# Patient Record
Sex: Female | Born: 1980
Health system: Southern US, Community
[De-identification: ages and names within clinical notes are randomized; demographics above are authoritative.]

## PROBLEM LIST (undated history)

## (undated) DIAGNOSIS — E079 Disorder of thyroid, unspecified: Secondary | ICD-10-CM

## (undated) DIAGNOSIS — I10 Essential (primary) hypertension: Secondary | ICD-10-CM

## (undated) HISTORY — PX: THYROID SURGERY: SHX805

---
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
## 2001-10-29 ENCOUNTER — Other Ambulatory Visit: Admission: RE | Admit: 2001-10-29 | Discharge: 2001-10-29 | Payer: Self-pay | Admitting: Gynecology

## 2002-11-07 ENCOUNTER — Other Ambulatory Visit: Admission: RE | Admit: 2002-11-07 | Discharge: 2002-11-07 | Payer: Self-pay | Admitting: Gynecology

## 2003-10-08 ENCOUNTER — Other Ambulatory Visit: Admission: RE | Admit: 2003-10-08 | Discharge: 2003-10-08 | Payer: Self-pay | Admitting: Gynecology

## 2004-06-14 ENCOUNTER — Other Ambulatory Visit: Admission: RE | Admit: 2004-06-14 | Discharge: 2004-06-14 | Payer: Self-pay | Admitting: Gynecology

## 2004-07-19 ENCOUNTER — Other Ambulatory Visit: Admission: RE | Admit: 2004-07-19 | Discharge: 2004-07-19 | Payer: Self-pay | Admitting: Gynecology

## 2004-07-25 ENCOUNTER — Emergency Department (HOSPITAL_COMMUNITY): Admission: EM | Admit: 2004-07-25 | Discharge: 2004-07-25 | Payer: Self-pay | Admitting: Family Medicine

## 2004-08-08 ENCOUNTER — Other Ambulatory Visit: Admission: RE | Admit: 2004-08-08 | Discharge: 2004-08-08 | Payer: Self-pay | Admitting: Gynecology

## 2004-08-25 ENCOUNTER — Encounter: Admission: RE | Admit: 2004-08-25 | Discharge: 2004-08-25 | Payer: Self-pay | Admitting: Family Medicine

## 2004-09-11 ENCOUNTER — Emergency Department (HOSPITAL_COMMUNITY): Admission: EM | Admit: 2004-09-11 | Discharge: 2004-09-12 | Payer: Self-pay | Admitting: Emergency Medicine

## 2004-09-19 ENCOUNTER — Encounter (INDEPENDENT_AMBULATORY_CARE_PROVIDER_SITE_OTHER): Payer: Self-pay | Admitting: *Deleted

## 2004-09-19 ENCOUNTER — Ambulatory Visit (HOSPITAL_COMMUNITY): Admission: RE | Admit: 2004-09-19 | Discharge: 2004-09-19 | Payer: Self-pay | Admitting: Family Medicine

## 2004-10-12 ENCOUNTER — Encounter: Admission: RE | Admit: 2004-10-12 | Discharge: 2004-10-12 | Payer: Self-pay | Admitting: Otolaryngology

## 2004-10-14 ENCOUNTER — Encounter: Admission: RE | Admit: 2004-10-14 | Discharge: 2004-10-14 | Payer: Self-pay | Admitting: Otolaryngology

## 2004-12-01 ENCOUNTER — Ambulatory Visit (HOSPITAL_COMMUNITY): Admission: RE | Admit: 2004-12-01 | Discharge: 2004-12-02 | Payer: Self-pay | Admitting: General Surgery

## 2004-12-01 ENCOUNTER — Encounter (INDEPENDENT_AMBULATORY_CARE_PROVIDER_SITE_OTHER): Payer: Self-pay | Admitting: Specialist

## 2005-01-17 ENCOUNTER — Observation Stay (HOSPITAL_COMMUNITY): Admission: RE | Admit: 2005-01-17 | Discharge: 2005-01-18 | Payer: Self-pay | Admitting: General Surgery

## 2005-01-17 ENCOUNTER — Encounter (INDEPENDENT_AMBULATORY_CARE_PROVIDER_SITE_OTHER): Payer: Self-pay | Admitting: *Deleted

## 2005-03-08 ENCOUNTER — Other Ambulatory Visit: Admission: RE | Admit: 2005-03-08 | Discharge: 2005-03-08 | Payer: Self-pay | Admitting: Gynecology

## 2005-10-23 ENCOUNTER — Other Ambulatory Visit: Admission: RE | Admit: 2005-10-23 | Discharge: 2005-10-23 | Payer: Self-pay | Admitting: Gynecology

## 2006-04-27 ENCOUNTER — Other Ambulatory Visit: Admission: RE | Admit: 2006-04-27 | Discharge: 2006-04-27 | Payer: Self-pay | Admitting: Gynecology

## 2007-01-22 IMAGING — RF DG ESOPHAGUS
12 series · 20 of 20 positions shown · non-contrast
Comparison: none

CLINICAL DATA: Difficulty swallowing. 
 BARIUM SWALLOW:
 A double contrast barium swallow was performed.  The mucosa of the esophagus appears normal.  The swallowing mechanism is normal and esophageal peristalsis is normal.  No hiatal hernia or reflux is seen.  A barium pill was given at the end of the study which passed into the stomach without delay.

[Series 1: run · 1 of 1 slices shown (1 of 12)]
[im 1/1]
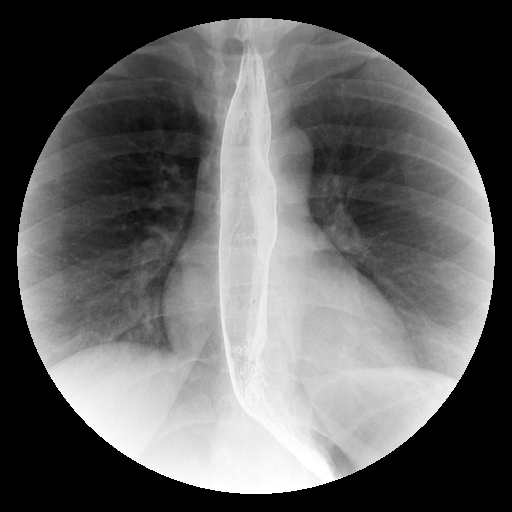

[Series 2: run · 1 of 1 slices shown (2 of 12)]
[im 1/1]
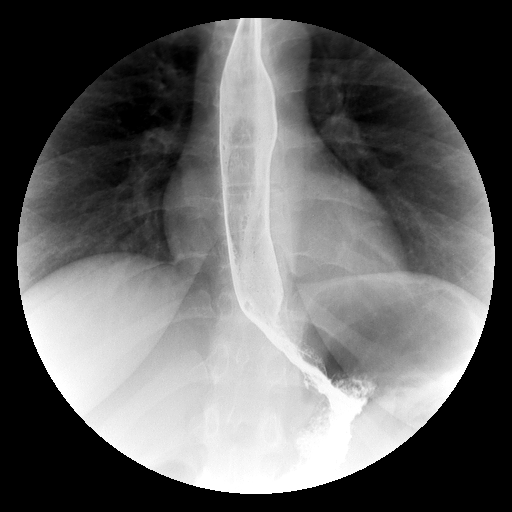

[Series 4: run · 1 of 1 slices shown (3 of 12)]
[im 1/1]
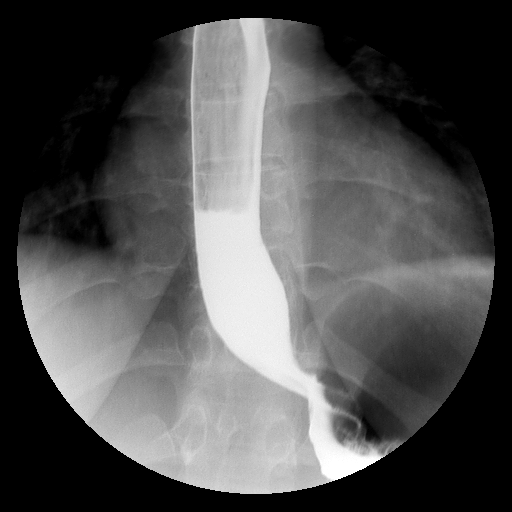

[Series 5: run · 6 of 6 slices shown (4 of 12)]
[im 1/6]
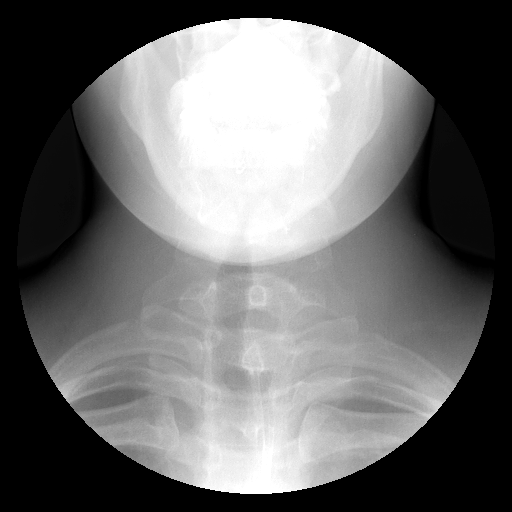
[im 2/6]
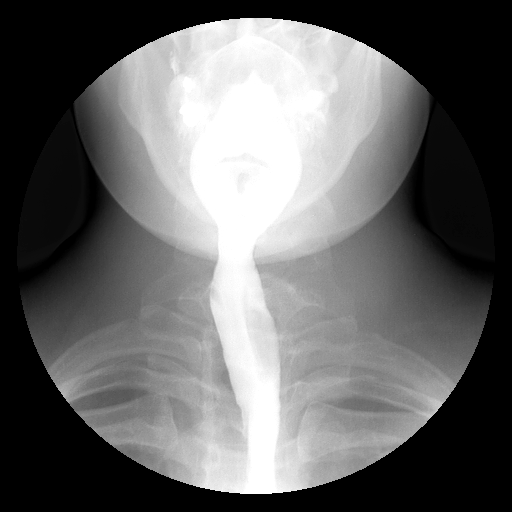
[im 3/6]
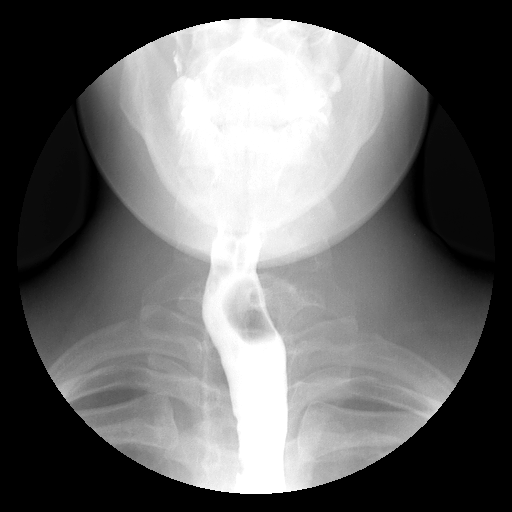
[im 4/6]
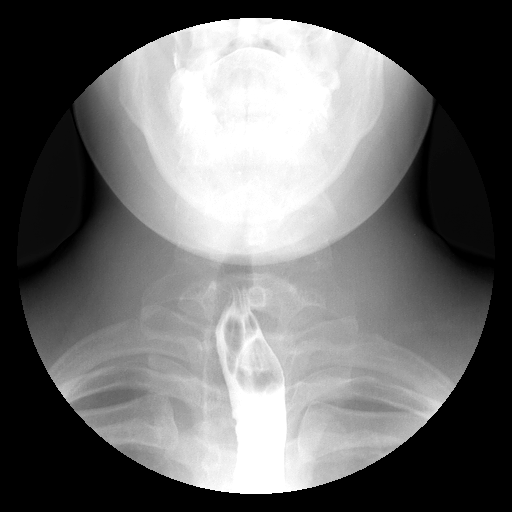
[im 5/6]
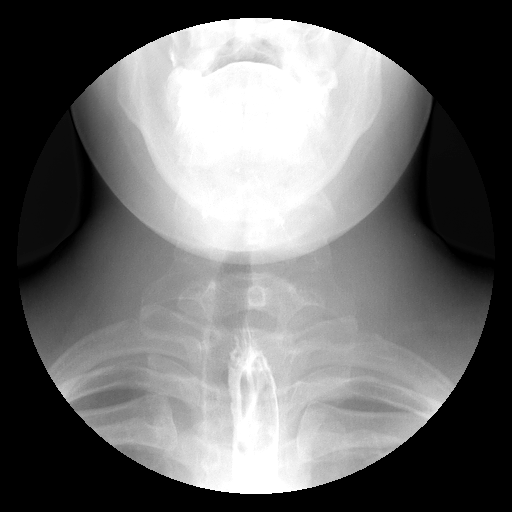
[im 6/6]
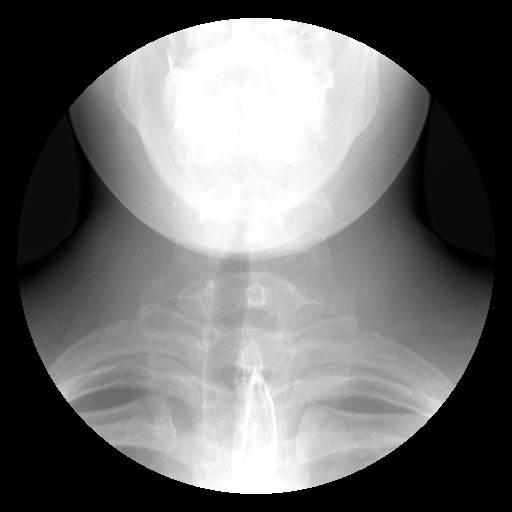

[Series 6: run · 4 of 4 slices shown (5 of 12)]
[im 1/4]
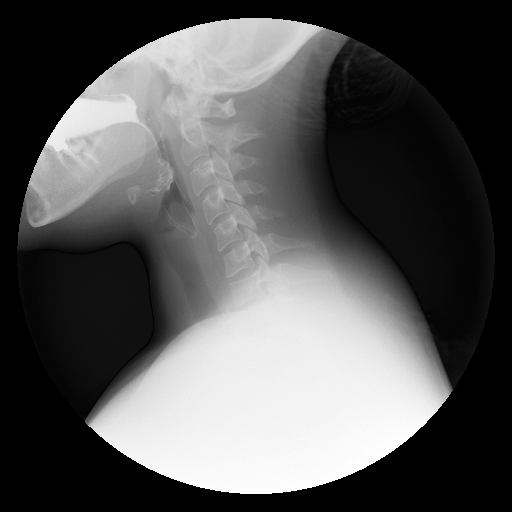
[im 2/4]
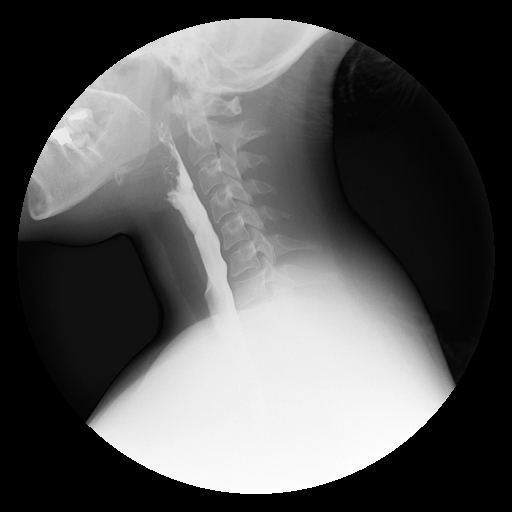
[im 3/4]
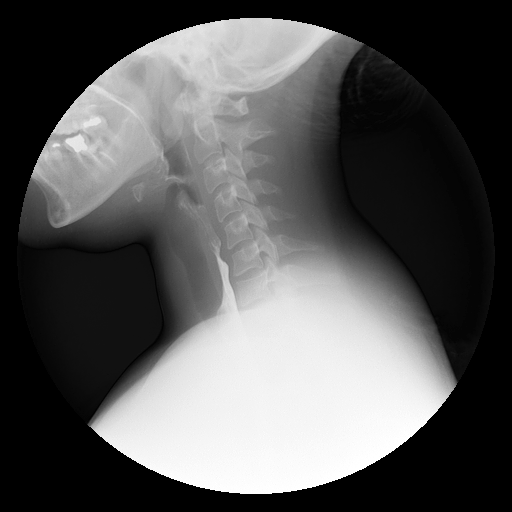
[im 4/4]
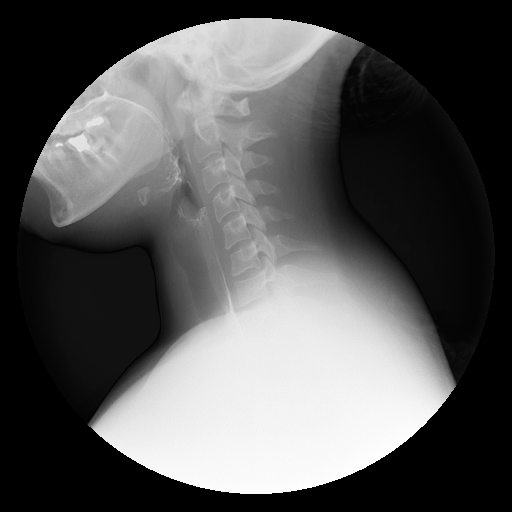

[Series 7: run · 1 of 1 slices shown (6 of 12)]
[im 1/1]
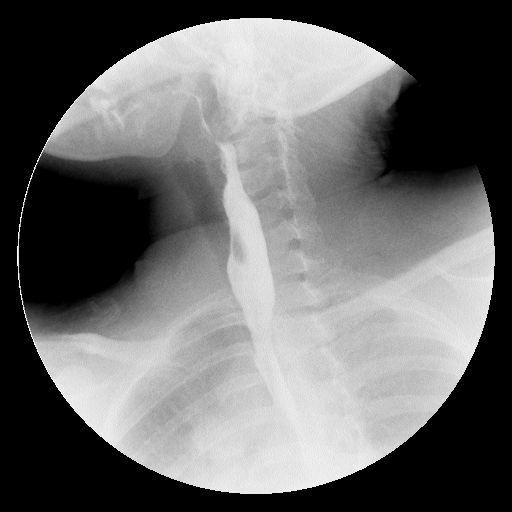

[Series 8: run · 1 of 1 slices shown (7 of 12)]
[im 1/1]
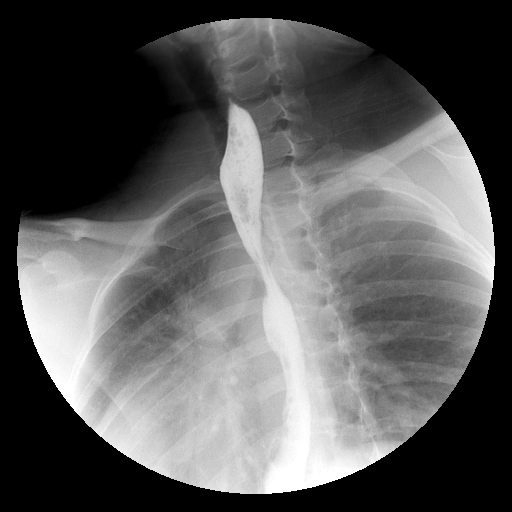

[Series 9: run · 1 of 1 slices shown (8 of 12)]
[im 1/1]
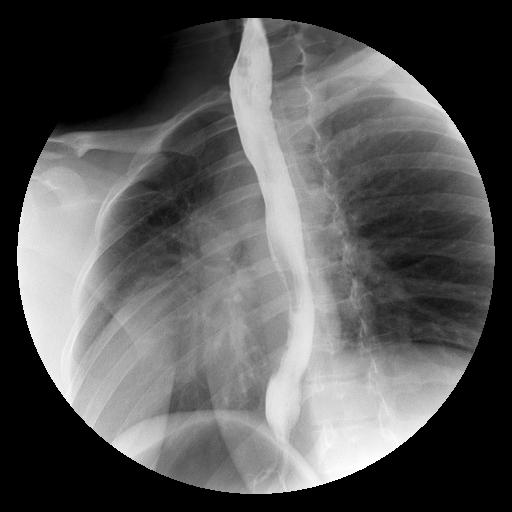

[Series 10: run · 1 of 1 slices shown (9 of 12)]
[im 1/1]
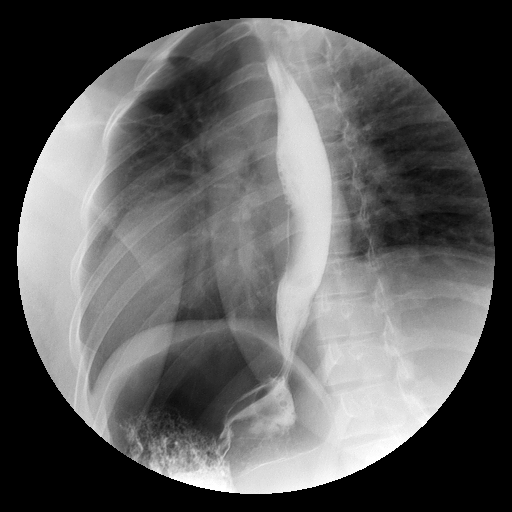

[Series 11: run · 1 of 1 slices shown (10 of 12)]
[im 1/1]
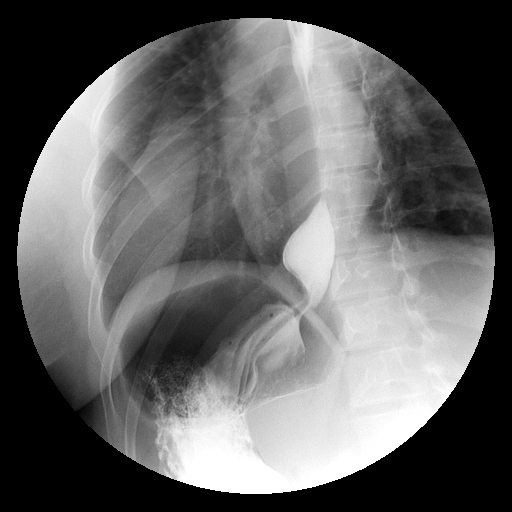

[Series 12: run · 1 of 1 slices shown (11 of 12)]
[im 1/1]
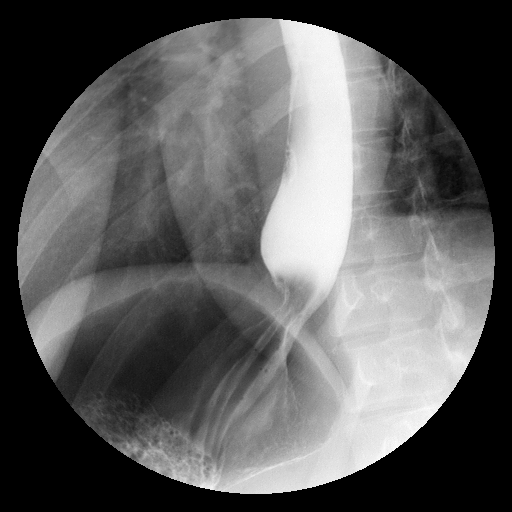

[Series 13: run · 1 of 1 slices shown (12 of 12)]
[im 1/1]
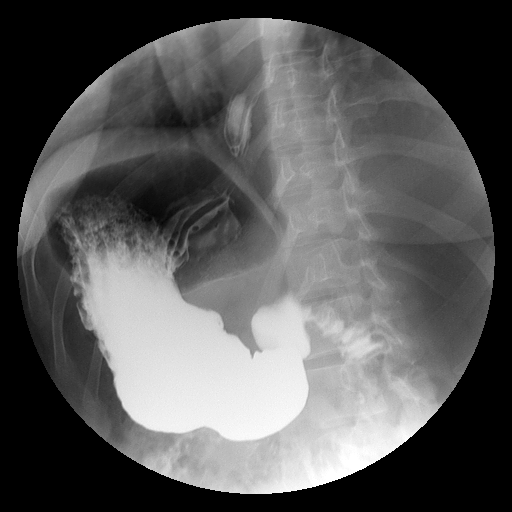

[20 of 20 positions shown; findings below may reference images not displayed]

IMPRESSION: Negative barium swallow.

## 2007-09-24 ENCOUNTER — Other Ambulatory Visit: Admission: RE | Admit: 2007-09-24 | Discharge: 2007-09-24 | Payer: Self-pay | Admitting: Family Medicine

## 2007-11-05 ENCOUNTER — Ambulatory Visit (HOSPITAL_BASED_OUTPATIENT_CLINIC_OR_DEPARTMENT_OTHER): Admission: RE | Admit: 2007-11-05 | Discharge: 2007-11-05 | Payer: Self-pay | Admitting: Orthopedic Surgery

## 2008-10-20 ENCOUNTER — Inpatient Hospital Stay (HOSPITAL_COMMUNITY): Admission: RE | Admit: 2008-10-20 | Discharge: 2008-10-22 | Payer: Self-pay | Admitting: Obstetrics and Gynecology

## 2009-10-22 ENCOUNTER — Other Ambulatory Visit: Admission: RE | Admit: 2009-10-22 | Discharge: 2009-10-22 | Payer: Self-pay | Admitting: Family Medicine

## 2010-03-05 ENCOUNTER — Ambulatory Visit: Payer: Self-pay | Admitting: Radiology

## 2010-03-05 ENCOUNTER — Emergency Department (HOSPITAL_BASED_OUTPATIENT_CLINIC_OR_DEPARTMENT_OTHER): Admission: EM | Admit: 2010-03-05 | Discharge: 2010-03-05 | Payer: Self-pay | Admitting: Emergency Medicine

## 2010-06-17 ENCOUNTER — Ambulatory Visit: Payer: Self-pay | Admitting: Diagnostic Radiology

## 2010-06-17 ENCOUNTER — Emergency Department (HOSPITAL_BASED_OUTPATIENT_CLINIC_OR_DEPARTMENT_OTHER): Admission: EM | Admit: 2010-06-17 | Discharge: 2010-06-18 | Payer: Self-pay | Admitting: Emergency Medicine

## 2010-11-09 LAB — BASIC METABOLIC PANEL
BUN: 11 mg/dL (ref 6–23)
CO2: 23 mEq/L (ref 19–32)
Calcium: 9.1 mg/dL (ref 8.4–10.5)
Chloride: 108 mEq/L (ref 96–112)
Creatinine, Ser: 0.8 mg/dL (ref 0.4–1.2)
GFR calc Af Amer: >60 mL/min (ref >60)
GFR calc non Af Amer: >60 mL/min (ref >60)
Glucose, Bld: 84 mg/dL (ref 70–99)
Potassium: 3.9 mEq/L (ref 3.5–5.1)
Sodium: 140 mEq/L (ref 135–145)

## 2010-11-09 LAB — DIFFERENTIAL
Basophils Absolute: 0.1 10*3/uL (ref 0.0–0.1)
Basophils Relative: 1 % (ref 0–1)
Eosinophils Absolute: 0.1 10*3/uL (ref 0.0–0.7)
Eosinophils Relative: 1 % (ref 0–5)
Lymphocytes Relative: 28 % (ref 12–46)
Lymphs Abs: 2.8 10*3/uL (ref 0.7–4.0)
Monocytes Absolute: 0.5 10*3/uL (ref 0.1–1.0)
Monocytes Relative: 5 % (ref 3–12)
Neutro Abs: 6.6 10*3/uL (ref 1.7–7.7)
Neutrophils Relative %: 66 % (ref 43–77)

## 2010-11-09 LAB — POCT CARDIAC MARKERS
CKMB, poc: <1 ng/mL — ABNORMAL LOW (ref 1.0–8.0)
Myoglobin, poc: 41.5 ng/mL (ref 12–200)
Troponin i, poc: <0.05 ng/mL (ref 0.00–0.09)

## 2010-11-09 LAB — CBC
HCT: 38 % (ref 36.0–46.0)
Hemoglobin: 12.6 g/dL (ref 12.0–15.0)
MCH: 32 pg (ref 26.0–34.0)
MCHC: 33.3 g/dL (ref 30.0–36.0)
MCV: 96 fL (ref 78.0–100.0)
Platelets: 237 10*3/uL (ref 150–400)
RBC: 3.95 MIL/uL (ref 3.87–5.11)
RDW: 12.8 % (ref 11.5–15.5)
WBC: 10.1 10*3/uL (ref 4.0–10.5)

## 2010-12-13 LAB — CBC
HCT: 38.9 % (ref 36.0–46.0)
Hemoglobin: 13.2 g/dL (ref 12.0–15.0)
MCHC: 33.8 g/dL (ref 30.0–36.0)
MCV: 101.5 fL — ABNORMAL HIGH (ref 78.0–100.0)
Platelets: 163 10*3/uL (ref 150–400)
RBC: 3.84 MIL/uL — ABNORMAL LOW (ref 3.87–5.11)
RDW: 13 % (ref 11.5–15.5)
WBC: 10 10*3/uL (ref 4.0–10.5)

## 2010-12-13 LAB — RPR: RPR Ser Ql: NONREACTIVE

## 2010-12-13 LAB — NO BLOOD PRODUCTS

## 2011-01-10 NOTE — Op Note (Signed)
[name], [name]               ACCOUNT NO.:  [id number]   MEDICAL RECORD NO.:  [id number]          PATIENT TYPE:  AMB   LOCATION:  DSC                          FACILITY:  [facility]   PHYSICIAN:  Leonides Grills, M.D.     DATE OF BIRTH:  21-Mar-1981   DATE OF PROCEDURE:  11/05/2007  DATE OF DISCHARGE:                               OPERATIVE REPORT   PREOPERATIVE DIAGNOSIS:  Left hallux valgus.   POSTOPERATIVE DIAGNOSIS:  Left hallux valgus.   OPERATION:  1. Left Chevron bunionectomy.  2. Stress x-rays, left foot.  3. Left great toe digital nerve neurolysis.   ANESTHESIA:  General.   SURGEON:  Leonides Grills, MD.   ASSISTANT:  Richardean Canal, PA-C.   ESTIMATED BLOOD LOSS:  Minimal.   TOURNIQUET TIME:  Approximately 40 minutes.   COMPLICATIONS:  None.   DISPOSITION:  Stable to PR.   INDICATIONS:  This is a 30 year old female who has had longstanding left  bunion pain despite wearing wider toe box shoes and occasional anti-  inflammatories.  She was consented for the above procedure.  All the  risks including infection, nerve vessel injury, nonunion, malunion,  hardware rotation,  hardware failure, stiffness, arthritis, recurrence  of hallux valgus deformity and development of hallux varus were all  explained.  Questions were encouraged and answered.   DESCRIPTION OF PROCEDURE:  The patient was brought to the operating room  and placed in supine position after adequate general endotracheal tube  anesthesia was administered as well as Ancef 1 gram IV piggyback.  The  left lower extremity was then prepped and draped in a sterile manner.  Over a proximally placed thigh tourniquet, the limb was gravity  exsanguinated and the tourniquet was slowly inflated to 90 mmHg.  A  longitudinal incision over the medial aspect of the left great toe MTP  joint was then made.  Dissection was carried down through skin.  Hemostasis was obtained.  A formal digital nerve neurolysis was  performed on  the dorsomedial digital nerve.  This was then retracted out  of harm's way.  An L-shaped capsulotomy was then made.  Simple  bunionectomy then performed with a sagittal saw.  Rocky Link Johnson's ridge  was then rounded off with a rongeur.  The joint area was copiously  irrigated with normal saline.  The lateral capsule was then released  with a curved Beaver blade.  This helped release the tight capsule.  The  center head was then identified.  The soft tissue was elevated both  superiorly and inferiorly.  A Chevron osteotomy was created with a  sagittal saw.  The head was then translated about 3-4 mm laterally.  This was then fixed with a 2.0-mm fully threaded cortical set screw  using a 1.5-mm drill hole respectively.  Excellent purchase was made of  the reduction.  This was countersunk.  The redundant bone medially was  then trimmed off with the sagittal saw.  The area joint was copiously  irrigated with normal saline.  The capsule was advanced both superiorly  and proximally and reconstructed with 2-0 Vicryl stitches, and  had an  Conservation officer, historic buildings.  The tourniquet was deflated.  Hemostasis was  obtained.  Stress x-rays were obtained in the AP and lateral planes and  showed no gross motion across the osteotomy site fixation, proper  position and excellent alignment as well.  The wound was closed with 4-0  nylon stitch.  A sterile dressing was applied.  A Roger Mann dressing  was applied.  Hard sole shoe was applied.  The patient was stable to the  PR.      Leonides Grills, M.D.  Electronically Signed     PB/MEDQ  D:  11/05/2007  T:  11/06/2007  Job:  [id number]

## 2011-01-10 NOTE — Discharge Summary (Signed)
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   DISCHARGE DIAGNOSES:  Intrauterine pregnancy at 41 weeks and group B  streptococci carrier.   PROCEDURE:  On February 23, she had a spontaneous vaginal delivery.   HISTORY AND PHYSICAL:  This is a 30 year old gravida 2, para 1-0-0-1  with an EGA of [redacted] weeks, who presents for induction.  Prenatal care  essentially uncomplicated.   PRENATAL LABS:  RPR is nonreactive, hepatitis B surface antigen  negative, rubella immune, HIV negative, blood type is A+ with a negative  antibody screen, gonorrhea and Chlamydia negative, normal adult  hemoglobin, cystic fibrosis is negative, first trimester screen is  normal, MSAFP is normal, 1-hour Glucola is 119, and group B strep is  positive.   PAST OBSTETRICAL HISTORY:  In 2000, vaginal delivery at 38 weeks, 6  pounds and 6 ounces.  No complications.   PAST MEDICAL HISTORY:  Hypothyroidism.   PAST SURGICAL HISTORY:  Thyroidectomy and bunion surgery.   ALLERGIES:  None.   MEDICATIONS:  Synthroid 175 mcg.   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   PHYSICAL EXAMINATION:  She is afebrile with stable vital signs.  Fetal  heart tracing is level 1 with irregular contractions.  Abdomen, gravid  and nontender with an estimated fetal weight of 7 pounds.  Cervix is 2-  3, 60, -2 vertex presentation, adequate pelvis.  I attempted to rupture  membranes, but got no fluid.   HOSPITAL COURSE:  The patient was admitted and started on penicillin for  group B strep  prophylaxis.  She had Pitocin started for induction and I  also attempted rupture of membranes.  She progressed into labor,  received an epidural, progressed fairly quickly to complete, pushed well  and on February 23, had a vaginal delivery of a viable female infant  with Apgars of 9 and 9 and weighed 7 pounds 0 ounces.  Placenta  delivered spontaneously and was intact.  Perineum was intact, and  estimated blood loss was less than 500 mL.  Postpartum, she had no  significant problems and on postpartum #2, she was stable enough for  discharge home.  Predelivery hemoglobin was 13.2 and it was not checked  after delivery.   DISCHARGE INSTRUCTIONS:  Regular diet, pelvic rest, followup in 6 weeks.   MEDICATIONS:  Percocet #20 one to two p.o. q.4-6 h. p.r.n. pain and over-  the-counter ibuprofen as needed, and she was given our discharge  pamphlet.       Zenaida Niece, M.D.  Electronically Signed     TDM/MEDQ  D:  10/22/2008  T:  10/22/2008  Job:  [id number]

## 2011-01-13 NOTE — Op Note (Signed)
NAMEMAHREEN, [name]               ACCOUNT NO.:  [id number]   MEDICAL RECORD NO.:  [id number]          PATIENT TYPE:  OIB   LOCATION:  2899                         FACILITY:  [facility]   PHYSICIAN:  Gita Kudo, M.D. DATE OF BIRTH:  Jul 01, 1981   DATE OF PROCEDURE:  12/01/2004  DATE OF DISCHARGE:                                 OPERATIVE REPORT   OPERATIVE PROCEDURE:  Left thyroid lobectomy.   SURGEON:  Gita Kudo, M.D.   ANESTHESIA:  General endotracheal.   PREOPERATIVE DIAGNOSIS:  Mass left thyroid lobe.   POSTOP DIAGNOSIS:  Mass left thyroid lobe, pending final pathology.   CLINICAL SUMMARY:  A 30 year old female with enlarging mass in her left  neck. Has had previous needle aspiration cytology. The lump has increased in  size and she comes in now for elective excision. She has had a second  opinion from Dr. Hermelinda Medicus also.   OPERATIVE FINDINGS:  The left lobe was quite enlarged and felt significantly  cystic. The lobe was soft. The right side felt and looked normal. The left  recurrent nerve was identified and not injured. At least one parathyroid was  noted and not injured.   OPERATIVE PROCEDURE:  Under satisfactory general endotracheal anesthesia,  the patient was positioned, prepped and draped in standard fashion. A  transverse incision was made in a neck crease and carried down through the  platysma. Then flaps were developed superiorly and inferiorly and self-  retaining retractors placed. The midline was opened and strap muscles  retracted laterally. The large left lobe of the thyroid was easily  manipulated into good exposure. Superior pole vessels taken down between  clamps and ties of 2-0 silk and large metal clips. Medially the soft tissue  was divided between clips and using cautery. The recurrent nerve was  identified and not injured. The inferior vein was divided between clips.  Then the gland was rolled medially and taken off the trachea. The  pyramidal  lobe was divided and dissected away from the thyroid with cautery and then  the isthmus was divided close to the right lobe. The specimen was then sent  for pathology. The isthmus was secured with 3-0 Vicryl suture. The wound was  lavaged with saline, checked for hemostasis and then closed while waiting  for pathology. A small piece of Surgicel was placed in the operative bed.  The wound was closed in layers with running #3-0 Vicryl for the midline,  interrupted 3-0 Vicryl for the platysma, 4-0 Vicryl for subcu and Steri-  Strips for skin. Sterile dressings were applied and the patient went to the  recovery room from the operating room in good condition.   ADDENDUM:  The pathologist called back for report of the frozen section. He  could not definitely say that it was malignant nor benign. Several more  studies needed to be done and they recommended no further surgery at this  point but to wait for the permanents.      MRL/MEDQ  D:  12/01/2004  T:  12/01/2004  Job:  [id number]   cc:   Chesley Mires  Virl Son, M.D.  [address]  [place]  [place] [address]  Fax: [phone]   Hermelinda Medicus, M.D.  [address]  [place] [address]  Fax: [phone]

## 2011-01-13 NOTE — Op Note (Signed)
NAMECASSANDRA, [name]               ACCOUNT NO.:  [id number]   MEDICAL RECORD NO.:  [id number]          PATIENT TYPE:  AMB   LOCATION:  DAY                          FACILITY:  [facility]   PHYSICIAN:  Gita Kudo, M.D. DATE OF BIRTH:  November 23, 1980   DATE OF PROCEDURE:  01/17/2005  DATE OF DISCHARGE:                                 OPERATIVE REPORT   OPERATIVE PROCEDURE:  Right thyroid lobectomy with right superior  parathyroid cystectomy, completion thyroidectomy.   SURGEON:  Gita Kudo, M.D.   ASSISTANT SURGEON:  Velora Heckler, M.D.   ANESTHESIA:  General endotracheal.   PREOPERATIVE DIAGNOSIS:  Left thyroid carcinoma.   POSTOPERATIVE DIAGNOSIS:  Left thyroid carcinoma, completion thyroidectomy.   CLINICAL SUMMARY:  This 30 year old lady underwent a left thyroid lobectomy  on April 6. Initial pathology was equivocal. 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 It was quite small. After deliberation, we decided to  do a completion thyroidectomy.   OPERATIVE FINDINGS:  The right lobe of the thyroid appeared normal in size  and no palpable abnormality. The right superior parathyroid was enlarged and  had a small cyst. It was felt reasonable to remove this with the specimen.  The recurrent nerve was identified and not injured.   OPERATIVE PROCEDURE:  Under satisfactory general endotracheal anesthesia,  the patient had received 1.0 grams Ancef and was prepped, positioned and  draped in standard fashion. The previous incision was reopened in its  lateral 2/3 and extended somewhat laterally. Carried down to the platysma  and flaps developed. The hemostasis was good by cautery or silk tie. The  midline was opened. There was a little bit of difficulty because of her  previous recent surgery. The strap muscles retracted laterally. The superior  pole was taken down between metal clips and 2-0 silk ties. The  inferior and  medial veins were divided between ties. Then the thyroid gland was rolled  medially and carefully dissected away from the recurrent nerve which was  identified. I saw the inferior parathyroid and left it alone. The superior  parathyroid looked a little large and had a large cyst on it and therefore I  removed it. The dissection was carried down to the trachea and the gland  rolled medially until it was totally removed. Cautery was used to control  bleeding, as was tie and metal clips. A small piece of Surgicel was placed  in the wound and pressure held after lavage with saline and there appeared  to be no bleeding. The specimen was marked for pathology and  sent. The wound was then closed in layers with running 4-0 for the midline,  interrupted 4-0 platysma, 4-0 subcu - all Vicryl. Skin edges approximated  with Steri-Strips and sterile dressing applied. There were no complications.  The sponge and needle counts were correct.      MRL/MEDQ  D:  01/17/2005  T:  01/17/2005  Job:  [id number]   cc:   Otilio Connors. Gerri Spore, M.D.  [address]  [place]  [place] [address]  Fax: [phone]

## 2011-02-15 ENCOUNTER — Other Ambulatory Visit: Payer: Self-pay | Admitting: Family Medicine

## 2011-02-15 ENCOUNTER — Other Ambulatory Visit (HOSPITAL_COMMUNITY)
Admission: RE | Admit: 2011-02-15 | Discharge: 2011-02-15 | Disposition: A | Discharge status: Home / Self Care | Payer: BC Managed Care – PPO | Source: Ambulatory Visit | Attending: Family Medicine | Admitting: Family Medicine

## 2011-02-15 DIAGNOSIS — Z124 Encounter for screening for malignant neoplasm of cervix: Secondary | ICD-10-CM | POA: Insufficient documentation

## 2011-05-22 LAB — POCT HEMOGLOBIN-HEMACUE: Hemoglobin: 13

## 2011-06-08 ENCOUNTER — Emergency Department (HOSPITAL_BASED_OUTPATIENT_CLINIC_OR_DEPARTMENT_OTHER)
Admission: EM | Admit: 2011-06-08 | Discharge: 2011-06-09 | Disposition: A | Discharge status: Home / Self Care | Payer: BC Managed Care – PPO | Attending: Emergency Medicine | Admitting: Emergency Medicine

## 2011-06-08 ENCOUNTER — Encounter: Payer: Self-pay | Admitting: *Deleted

## 2011-06-08 ENCOUNTER — Emergency Department: Payer: Self-pay | Admitting: Emergency Medicine

## 2011-06-08 DIAGNOSIS — E079 Disorder of thyroid, unspecified: Secondary | ICD-10-CM | POA: Insufficient documentation

## 2011-06-08 DIAGNOSIS — X58XXXA Exposure to other specified factors, initial encounter: Secondary | ICD-10-CM | POA: Insufficient documentation

## 2011-06-08 DIAGNOSIS — S61309A Unspecified open wound of unspecified finger with damage to nail, initial encounter: Secondary | ICD-10-CM

## 2011-06-08 DIAGNOSIS — I10 Essential (primary) hypertension: Secondary | ICD-10-CM | POA: Insufficient documentation

## 2011-06-08 DIAGNOSIS — S61209A Unspecified open wound of unspecified finger without damage to nail, initial encounter: Secondary | ICD-10-CM | POA: Insufficient documentation

## 2011-06-08 HISTORY — DX: Essential (primary) hypertension: I10

## 2011-06-08 HISTORY — DX: Disorder of thyroid, unspecified: E07.9

## 2011-06-08 NOTE — ED Notes (Signed)
Finger placed in betadine soak, pt tolerated well

## 2011-06-08 NOTE — ED Notes (Signed)
A box fell off a shelf causing her fingernail to bend backward. Partial amputation of her right 5th fingernail noted.

## 2011-06-09 MED ORDER — IBUPROFEN 800 MG PO TABS
800.0000 mg | ORAL_TABLET | Freq: Once | ORAL | Status: AC
  Administered 2011-06-09: 800 mg via ORAL
  Filled 2011-06-09: qty 1

## 2011-06-09 MED ORDER — IBUPROFEN 800 MG PO TABS: 800.0000 mg | ORAL_TABLET | Freq: Three times a day (TID) | ORAL | Status: AC

## 2011-06-09 MED ORDER — BUPIVACAINE HCL 0.5 % IJ SOLN
INTRAMUSCULAR | Status: AC
  Administered 2011-06-09
  Filled 2011-06-09: qty 1

## 2011-06-09 MED ORDER — HYDROCODONE-ACETAMINOPHEN 5-500 MG PO TABS: 1.0000 | ORAL_TABLET | Freq: Four times a day (QID) | ORAL | Status: AC | PRN

## 2011-06-09 NOTE — ED Notes (Signed)
Dr Alto Denver at bs for nail removal.

## 2011-06-09 NOTE — ED Notes (Signed)
Bacitracin applied to nail bed and area dressed with non-adherent dressing, finger splint, and bulky kerlix dressing. Pt tolerated well.

## 2011-06-09 NOTE — ED Provider Notes (Signed)
History     CSN: [id number] Arrival date & time: 06/08/2011 11:32 PM  Chief Complaint  Patient presents with  . Finger Injury    (Consider location/radiation/quality/duration/timing/severity/associated sxs/prior treatment) HPI The patient is a 30 year old female who presents today after having avulsion injury to the fingernail of her right pinky finger. Patient had on gel nails one of these caught on the surface. The nail was peeled back with almost complete avulsion of the nail.  Patient says the pain is a 10 out of 10. It is made worse with palpation. It better by isolating and not palpating it. There are no other associated modifying factors.  Past Medical History  Diagnosis Date  . Thyroid disease   . Hypertension     Past Surgical History  Procedure Date  . Thyroid surgery     No family history on file.  History  Substance Use Topics  . Smoking status: Never Smoker   . Smokeless tobacco: Not on file  . Alcohol Use: No    OB History    Grav Para Term Preterm Abortions TAB SAB Ect Mult Living                  Review of Systems  All other systems reviewed and are negative.    Allergies  Review of patient's allergies indicates no known allergies.  Home Medications   Current Outpatient Rx  Name Route Sig Dispense Refill  . HYDROCHLOROTHIAZIDE PO Oral Take 1 tablet by mouth daily.      Marland Kitchen SYNTHROID PO Oral Take 1 tablet by mouth daily.      . SERTRALINE HCL PO Oral Take 1 tablet by mouth daily.      Marland Kitchen HYDROCODONE-ACETAMINOPHEN 5-500 MG PO TABS Oral Take 1-2 tablets by mouth every 6 (six) hours as needed for pain. 30 tablet 0  . IBUPROFEN 800 MG PO TABS Oral Take 1 tablet (800 mg total) by mouth 3 (three) times daily. 21 tablet 0    BP 136/97  Pulse 95  Temp(Src) 98.2 F (36.8 C) (Oral)  Resp 20  SpO2 100%  Physical Exam  Nursing note and vitals reviewed. Constitutional: She appears well-developed and well-nourished. No distress.  HENT:  Head:  Normocephalic and atraumatic.  Eyes: Conjunctivae are normal. Pupils are equal, round, and reactive to light.  Neck: Normal range of motion.  Musculoskeletal:       Patient has right fifth finger injury with nail with adhered ge nail  over top of it peeled back halfway proximally towards the hand. There is minimal active bleeding with this.  Skin: Skin is warm and dry. No rash noted.  Psychiatric: She has a normal mood and affect.    ED Course  Procedures (including critical care time)  Labs Reviewed - No data to display No results found.   1. Fingernail avulsion, complete       MDM  Patient was evaluated and did have a nailbed injury. She had a digital block performed on the affected finger on the right fifth digit. Patient received Marcaine for this. With this patient achieved good analgesia following this patient had the area prepped and draped in sterile fashion. Using a #11 blade as well as curved scissors the nail was peeled was able to be part away from the nailbed patient was discharged in good condition. She was discharged with prescriptions for pain medication as well as bacitracin ointment. Patient was bandage with this ointment and Vaseline gauze in place prior  to discharge. She was instructed on the use of all of her medications. Patient was discharged home in good condition.        Cyndra Numbers, MD 06/09/11 [phone]

## 2011-06-09 NOTE — ED Notes (Signed)
MD at bedside. 

## 2013-03-19 DIAGNOSIS — I10 Essential (primary) hypertension: Secondary | ICD-10-CM | POA: Insufficient documentation

## 2013-03-19 DIAGNOSIS — Z85850 Personal history of malignant neoplasm of thyroid: Secondary | ICD-10-CM | POA: Insufficient documentation

## 2016-01-04 ENCOUNTER — Encounter (HOSPITAL_BASED_OUTPATIENT_CLINIC_OR_DEPARTMENT_OTHER): Payer: Self-pay | Admitting: *Deleted

## 2016-01-04 ENCOUNTER — Emergency Department (HOSPITAL_BASED_OUTPATIENT_CLINIC_OR_DEPARTMENT_OTHER)
Admission: EM | Admit: 2016-01-04 | Discharge: 2016-01-04 | Disposition: A | Discharge status: Home / Self Care | Payer: 59 | Attending: Emergency Medicine | Admitting: Emergency Medicine

## 2016-01-04 ENCOUNTER — Emergency Department (HOSPITAL_BASED_OUTPATIENT_CLINIC_OR_DEPARTMENT_OTHER): Payer: 59

## 2016-01-04 DIAGNOSIS — I10 Essential (primary) hypertension: Secondary | ICD-10-CM | POA: Diagnosis not present

## 2016-01-04 DIAGNOSIS — R05 Cough: Secondary | ICD-10-CM | POA: Diagnosis present

## 2016-01-04 DIAGNOSIS — J40 Bronchitis, not specified as acute or chronic: Secondary | ICD-10-CM

## 2016-01-04 DIAGNOSIS — Z79899 Other long term (current) drug therapy: Secondary | ICD-10-CM | POA: Diagnosis not present

## 2016-01-04 DIAGNOSIS — J069 Acute upper respiratory infection, unspecified: Secondary | ICD-10-CM

## 2016-01-04 MED ORDER — ALBUTEROL SULFATE HFA 108 (90 BASE) MCG/ACT IN AERS
2.0000 | INHALATION_SPRAY | Freq: Four times a day (QID) | RESPIRATORY_TRACT | Status: DC
  Administered 2016-01-04: 2 via RESPIRATORY_TRACT
  Filled 2016-01-04: qty 6.7

## 2016-01-04 MED ORDER — IPRATROPIUM-ALBUTEROL 0.5-2.5 (3) MG/3ML IN SOLN
3.0000 mL | Freq: Four times a day (QID) | RESPIRATORY_TRACT | Status: DC
  Administered 2016-01-04: 3 mL via RESPIRATORY_TRACT
  Filled 2016-01-04: qty 3

## 2016-01-04 MED ORDER — DM-GUAIFENESIN ER 30-600 MG PO TB12: 1.0000 | ORAL_TABLET | Freq: Two times a day (BID) | ORAL | Status: DC

## 2016-01-04 MED FILL — MUCINEX DM ER 600-30 MG TAB: 30-600 | 10 days supply | Qty: 20 | Fill #0

## 2016-01-04 NOTE — ED Notes (Signed)
C/o cough/wheezing since Friday with nasal congestion. No fever. Also c/o dizziness. Has taken sudefed.

## 2016-01-04 NOTE — Discharge Instructions (Signed)
How to Use an Inhaler Using your inhaler correctly is very important. Good technique will make sure that the medicine reaches your lungs.  HOW TO USE AN INHALER: 1. Take the cap off the inhaler. 2. If this is the first time using your inhaler, you need to prime it. Shake the inhaler for 5 seconds. Release four puffs into the air, away from your face. Ask your doctor for help if you have questions. 3. Shake the inhaler for 5 seconds. 4. Turn the inhaler so the bottle is above the mouthpiece. 5. Put your pointer finger on top of the bottle. Your thumb holds the bottom of the inhaler. 6. Open your mouth. 7. Either hold the inhaler away from your mouth (the width of 2 fingers) or place your lips tightly around the mouthpiece. Ask your doctor which way to use your inhaler. 8. Breathe out as much air as possible. 9. Breathe in and push down on the bottle 1 time to release the medicine. You will feel the medicine go in your mouth and throat. 10. Continue to take a deep breath in very slowly. Try to fill your lungs. 11. After you have breathed in completely, hold your breath for 10 seconds. This will help the medicine to settle in your lungs. If you cannot hold your breath for 10 seconds, hold it for as long as you can before you breathe out. 12. Breathe out slowly, through pursed lips. Whistling is an example of pursed lips. 13. If your doctor has told you to take more than 1 puff, wait at least 15-30 seconds between puffs. This will help you get the best results from your medicine. Do not use the inhaler more than your doctor tells you to. 14. Put the cap back on the inhaler. 15. Follow the directions from your doctor or from the inhaler package about cleaning the inhaler. If you use more than one inhaler, ask your doctor which inhalers to use and what order to use them in. Ask your doctor to help you figure out when you will need to refill your inhaler.  If you use a steroid inhaler, always rinse your  mouth with water after your last puff, gargle and spit out the water. Do not swallow the water. GET HELP IF:  The inhaler medicine only partially helps to stop wheezing or shortness of breath.  You are having trouble using your inhaler.  You have some increase in thick spit (phlegm). GET HELP RIGHT AWAY IF:  The inhaler medicine does not help your wheezing or shortness of breath or you have tightness in your chest.  You have dizziness, headaches, or fast heart rate.  You have chills, fever, or night sweats.  You have a large increase of thick spit, or your thick spit is bloody. MAKE SURE YOU:   Understand these instructions.  Will watch your condition.  Will get help right away if you are not doing well or get worse.   This information is not intended to replace advice given to you by your health care provider. Make sure you discuss any questions you have with your health care provider.    Stop taking the Sudafed as we discussed because it elevates her blood pressure. Start taking the Mucinex DM use albuterol inhaler 2 puffs every 6 hours for the next 7 days. Make an appointment to follow-up with your regular Dr. Return for any new or worse symptoms. Work note provided.      Document Released: 05/23/2008 Document Revised:  06/04/2013 Document Reviewed: 03/13/2013 Elsevier Interactive Patient Education [employer].

## 2016-01-04 NOTE — ED Provider Notes (Signed)
CSN: [id number]     Arrival date & time 01/04/16  0855 History   First MD Initiated Contact with Patient 01/04/16 [phone]     Chief Complaint  Patient presents with  . Cough     (Consider location/radiation/quality/duration/timing/severity/associated sxs/prior Treatment) Patient is a 35 y.o. female presenting with cough. The history is provided by the patient.  Cough Associated symptoms: shortness of breath and wheezing   Associated symptoms: no chest pain, no fever, no headaches, no myalgias, no rash and no sore throat   Patient with complaint of cough congestion, occasionally productive cough, also feeling like she's been wheezing. Patient also concerned blood pressure's high. Patient has been taking Sudafed for the congestion. Patient also states she's been some mild dizziness but no room spinning. No nausea vomiting or diarrhea. No rash. No abdominal pain. No dysuria. Symptoms started on Friday.  Past Medical History  Diagnosis Date  . Thyroid disease   . Hypertension    Past Surgical History  Procedure Laterality Date  . Thyroid surgery     No family history on file. Social History  Substance Use Topics  . Smoking status: Never Smoker   . Smokeless tobacco: None  . Alcohol Use: No   OB History    No data available     Review of Systems  Constitutional: Negative for fever.  HENT: Positive for congestion. Negative for sore throat.   Eyes: Negative for redness.  Respiratory: Positive for cough, shortness of breath and wheezing.   Cardiovascular: Negative for chest pain.  Gastrointestinal: Negative for nausea, vomiting, abdominal pain and diarrhea.  Genitourinary: Negative for dysuria.  Musculoskeletal: Negative for myalgias.  Skin: Negative for rash.  Neurological: Positive for dizziness. Negative for headaches.  Hematological: Does not bruise/bleed easily.  Psychiatric/Behavioral: Negative for confusion.      Allergies  Review of patient's allergies indicates no  known allergies.  Home Medications   Prior to Admission medications   Medication Sig Start Date End Date Taking? Authorizing Provider  HYDROCHLOROTHIAZIDE PO Take 1 tablet by mouth daily.     Yes Historical Provider, MD  Levothyroxine Sodium (SYNTHROID PO) Take 1 tablet by mouth daily.     Yes Historical Provider, MD  SERTRALINE HCL PO Take 1 tablet by mouth daily.     Yes Historical Provider, MD  dextromethorphan-guaiFENesin (MUCINEX DM) 30-600 MG 12hr tablet Take 1 tablet by mouth 2 (two) times daily. 01/04/16   Vanetta MuldersScott Natesha Hassey, MD   BP 150/101 mmHg  Pulse 77  Temp(Src) 97.9 F (36.6 C) (Oral)  Resp 20  Ht 5\' 3"  (1.6 m)  Wt 108.863 kg  BMI 42.52 kg/m2  SpO2 100%  LMP 12/27/2015 (Approximate) Physical Exam  Constitutional: She is oriented to person, place, and time. She appears well-developed and well-nourished. No distress.  HENT:  Head: Normocephalic and atraumatic.  Mouth/Throat: Oropharynx is clear and moist. No oropharyngeal exudate.  Eyes: Conjunctivae and EOM are normal. Pupils are equal, round, and reactive to light.  Neck: Normal range of motion. Neck supple.  Cardiovascular: Normal rate, regular rhythm and normal heart sounds.   No murmur heard. Pulmonary/Chest: Effort normal. No respiratory distress. She has wheezes.  Abdominal: Soft. Bowel sounds are normal. There is no tenderness.  Musculoskeletal: Normal range of motion.  Neurological: She is alert and oriented to person, place, and time. No cranial nerve deficit. She exhibits normal muscle tone. Coordination normal.  Skin: Skin is warm. No rash noted.  Nursing note and vitals reviewed.   ED Course  Procedures (including critical care time) Labs Review Labs Reviewed - No data to display  Imaging Review Dg Chest 2 View  01/04/2016  CLINICAL DATA:  Nonproductive cough and wheezing for 4 days EXAM: CHEST  2 VIEW COMPARISON:  11/29/2004 FINDINGS: Normal heart size. Lungs clear. No pneumothorax. No pleural  effusion. IMPRESSION: No active cardiopulmonary disease. Electronically Signed   By: Jolaine Click M.D.   On: 01/04/2016 09:44   I have personally reviewed and evaluated these images and lab results as part of my medical decision-making.   EKG Interpretation None      MDM   Final diagnoses:  URI (upper respiratory infection)  Bronchitis    Patient with onset of upper respiratory infection symptoms on Friday. Chest x-rays negative for pneumonia. She did have some faint wheezing here. Patient treated with albuterol Atrovent nebulizer with improvement in her movement of her air.  Patient nontoxic no acute distress. Blood pressure is elevated but probably exacerbated by taking Sudafed. Patient will stop that. Will treat patient symptomatically with albuterol inhaler and Mucinex DM.    Vanetta Mulders, MD 01/04/16 1002

## 2016-10-24 ENCOUNTER — Other Ambulatory Visit (HOSPITAL_COMMUNITY)
Admission: RE | Admit: 2016-10-24 | Discharge: 2016-10-24 | Disposition: A | Discharge status: Home / Self Care | Payer: 59 | Source: Ambulatory Visit | Attending: Family Medicine | Admitting: Family Medicine

## 2016-10-24 ENCOUNTER — Other Ambulatory Visit: Payer: Self-pay | Admitting: Family Medicine

## 2016-10-24 DIAGNOSIS — Z01411 Encounter for gynecological examination (general) (routine) with abnormal findings: Secondary | ICD-10-CM | POA: Diagnosis not present

## 2016-10-25 LAB — CYTOLOGY - PAP: Diagnosis: NEGATIVE

## 2017-10-31 LAB — TSH: TSH: 7.8 — AB (ref 0.41–5.90)

## 2018-01-17 ENCOUNTER — Ambulatory Visit: Payer: 59 | Admitting: Endocrinology

## 2018-03-25 ENCOUNTER — Encounter: Payer: Self-pay | Admitting: Endocrinology

## 2018-03-25 ENCOUNTER — Ambulatory Visit (INDEPENDENT_AMBULATORY_CARE_PROVIDER_SITE_OTHER): Payer: 59 | Admitting: Endocrinology

## 2018-03-25 VITALS — BP 122/80 | HR 76 | Ht 63.0 in | Wt 248.6 lb

## 2018-03-25 DIAGNOSIS — E890 Postprocedural hypothyroidism: Secondary | ICD-10-CM | POA: Diagnosis not present

## 2018-03-25 DIAGNOSIS — Z85850 Personal history of malignant neoplasm of thyroid: Secondary | ICD-10-CM

## 2018-03-25 DIAGNOSIS — F329 Major depressive disorder, single episode, unspecified: Secondary | ICD-10-CM | POA: Diagnosis not present

## 2018-03-25 DIAGNOSIS — F32A Depression, unspecified: Secondary | ICD-10-CM

## 2018-03-25 LAB — TSH: TSH: 2.38 u[IU]/mL (ref 0.35–4.50)

## 2018-03-25 LAB — T4, FREE: Free T4: 0.94 ng/dL (ref 0.60–1.60)

## 2018-03-25 LAB — T3, FREE: T3 FREE: 2.9 pg/mL (ref 2.3–4.2)

## 2018-03-25 NOTE — Progress Notes (Signed)
Patient ID: [name], female   DOB: 1981-07-11, 37 y.o.   MRN: [id number]           Referring Physician: Shirlean Mylararol Webb  Reason for Appointment:  Hypothyroidism, new visit    History of Present Illness:   Hypothyroidism was first diagnosed in 2006 after thyroidectomy for thyroid cancer.           The patient has been treated with levothyroxine and reportedly had required progressively higher doses However she thinks she has been taking the same dose of 200 mcg for a couple of years, no records available from her PCP office    Although she had been usually feeling fairly good with her energy level she thinks she has been consistently more fatigued in the last year or so. She thinks that she has gained over 50 pounds over the last few years but no record of her previous weight is available from last year  She does take her levothyroxine very consistently a couple of hours before breakfast with water  She was referred here in March when her TSH was 7.8 but at that time no changes in her levothyroxine was made  She continues to feel fatigued, some daytime sleepiness present but no cold intolerance, dry skin or new hair loss She thinks her weight over the last month or 2 has started to come down with getting advice on lifestyle management with her wellness coach at work         Patient's weight history is as follows:  Wt Readings from Last 3 Encounters:  03/25/18 248 lb 9.6 oz (112.8 kg)  01/04/16 240 lb (108.9 kg)    Thyroid function results have been as follows:  Lab Results  Component Value Date   TSH 7.80 (A) 10/31/2017   THYROID cancer:  In 2006 the patient had noticed a knot on the left side of her neck and this eventually who was 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 She was treated with thyroidectomy Pathology revealed 2.4 cm in size with the following findings:  1. Maximum tumor size (cm): 2.4 cm 2. Tumor location: Left lobe 3.  Multicentricity within the lobe: No 4. Histology: Follicular variant of papillary carcinoma with oncocytic features and partially cystic pattern 5. Margins: Negative 6. Capsular invasion: No 7. Extrathyroid extension: No 8. Vascular/Lymphatic invasion: No 9. Lymph nodes: # examined -1; # positive - 0  She does not think she was followed regularly by an endocrinologist subsequently   Past Medical History:  Diagnosis Date  . Hypertension   . Thyroid disease     Past Surgical History:  Procedure Laterality Date  . THYROID SURGERY      History reviewed. No pertinent family history.  Social History:  reports that she has never smoked. She has never used smokeless tobacco. She reports that she has current or past drug history. Drug: PCP. She reports that she does not drink alcohol.  Allergies: No Known Allergies  Allergies as of 03/25/2018   No Known Allergies     Medication List        Accurate as of 03/25/18  9:38 AM. Always use your most recent med list.          amLODipine 5 MG tablet Commonly known as:  NORVASC TK 1 T PO ONCE D   citalopram 40 MG tablet Commonly known as:  CELEXA TK 1 T PO ONCE D   clonazePAM 0.5 MG tablet Commonly known as:  KLONOPIN TK  1 T PO AT BEDTIME PRN   hydrochlorothiazide 25 MG tablet Commonly known as:  HYDRODIURIL Take 25 mg by mouth every morning.   SYNTHROID 200 MCG tablet Generic drug:  levothyroxine TK 1 T PO QAM OES   Vitamin D (Ergocalciferol) 50000 units Caps capsule Commonly known as:  DRISDOL Take by mouth.          Review of Systems  Constitutional: Positive for weight gain.  HENT: Negative for hoarseness and trouble swallowing.   Respiratory: Negative for shortness of breath.   Cardiovascular: Negative for leg swelling.  Gastrointestinal: Positive for constipation.       Occasional  Endocrine: Positive for fatigue.  Musculoskeletal: Positive for muscle aches, muscle cramps and back pain.  Skin:  Negative for rash and dry skin.  Neurological: Negative for numbness.  Psychiatric/Behavioral: Positive for depressed mood and insomnia.       She continues to have difficulties with sleep and depression difficulties with sleep and depression               Examination:    BP 122/80 (BP Location: Left Arm, Patient Position: Sitting, Cuff Size: Normal)   Pulse 76   Ht 5\' 3"  (1.6 m)   Wt 248 lb 9.6 oz (112.8 kg)   SpO2 98%   BMI 44.04 kg/m   GENERAL:  Relatively large build.  Generalized obesity present  No pallor, clubbing No peripheral edema.  Skin:  no rash, has mild hyperpigmentation of the neck area around the collar zone  EYES:  No prominence of the eyes or swelling of the eyelids  ENT: Oral mucosa and tongue normal.  THYROID: Firm area felt in the medial right thyroid lobe, not nodular, about 2-3 cm across, felt on swallowing No enlargement felt of the left side No lymphadenopathy in the neck  HEART:  Normal  S1 and S2; no murmur or click.  CHEST:    Lungs: Vescicular breath sounds heard equally.  No crepitations/ wheeze.  ABDOMEN:  No distention.  Liver and spleen not palpable.  No other mass or tenderness.  NEUROLOGICAL: Reflexes are bilaterally normal at biceps and ankles.  JOINTS:  Normal appearance of joints in hands and feet.   Assessment:  HYPOTHYROIDISM since 2006, postsurgical and currently on 200 mcg levothyroxine  Her TSH was increased in March but no changes in levothyroxine made and needs to have this reassessed now Not clear if she is symptomatic from hypothyroidism as her fatigue and weight gain are likely nonspecific and related to other issues below  History of THYROID cancer diagnosed in 2006, follicular variant of papillary and 2.4 cm in size She had thyroidectomy but no radioactive iodine treatment and no subsequent follow-up assessment of recurrence Her exam indicates possible scar tissue in the right thyroid bed but not clear if this  could be remnant thyroid  DEPRESSION and insomnia: Not controlled currently and may well be more important because of her continued fatigue and tendency to weight gain  OBESITY  HYPERTENSION starting in youth, controlled and followed by her PCP  PLAN:  Check thyroid levels including T4, T3 and TSH Discussed adjusting the dose based on her labs Also may consider a combination of liothyronine and levothyroxine if her free T3 level is relatively low  Check thyroglobulin level for her thyroid cancer even though she has had a relatively low-grade tumor  Follow-up in about 6 weeks   Reather Littler 03/25/2018, 9:38 AM   Consultation note copy sent to the PCP  Note: This office note was prepared with Dragon voice recognition system technology. Any transcriptional errors that result from this process are unintentional.  

## 2018-03-27 LAB — THYROGLOBULIN BY IMA: Thyroglobulin by IMA: 0.9 ng/mL — ABNORMAL LOW (ref 1.5–38.5)

## 2018-03-27 LAB — TGAB+THYROGLOBULIN IMA OR LCMS

## 2018-04-15 ENCOUNTER — Telehealth: Payer: Self-pay | Admitting: Emergency Medicine

## 2018-04-15 NOTE — Telephone Encounter (Signed)
Pt called and wants to know if somebody can call her back about her lab results. Thanks.

## 2018-04-16 NOTE — Telephone Encounter (Signed)
I called patient & let her know that thyroid labs were normal from 7/29.

## 2018-05-13 ENCOUNTER — Other Ambulatory Visit: Payer: 59

## 2018-05-14 NOTE — Progress Notes (Deleted)
Patient ID: [name], female   DOB: 1981/04/05, 37 y.o.   MRN: [id number]           Referring Physician: Shirlean Mylar  Reason for Appointment:  Hypothyroidism, new visit    History of Present Illness:   Hypothyroidism was first diagnosed in 2006 after thyroidectomy for thyroid cancer.           The patient has been treated with levothyroxine and reportedly had required progressively higher doses However she thinks she has been taking the same dose of 200 mcg for a couple of years, no records available from her PCP office    Although she had been usually feeling fairly good with her energy level she thinks she has been consistently more fatigued in the last year or so. She thinks that she has gained over 50 pounds over the last few years but no record of her previous weight is available from last year  She does take her levothyroxine very consistently a couple of hours before breakfast with water  She was referred here in March when her TSH was 7.8 but at that time no changes in her levothyroxine was made  She continues to feel fatigued, some daytime sleepiness present but no cold intolerance, dry skin or new hair loss She thinks her weight over the last month or 2 has started to come down with getting advice on lifestyle management with her wellness coach at work         Patient's weight history is as follows:  Wt Readings from Last 3 Encounters:  03/25/18 248 lb 9.6 oz (112.8 kg)  01/04/16 240 lb (108.9 kg)    Thyroid function results have been as follows:  Lab Results  Component Value Date   TSH 2.38 03/25/2018   TSH 7.80 (A) 10/31/2017   FREET4 0.94 03/25/2018   T3FREE 2.9 03/25/2018   THYROID cancer:  In 2006 the patient had noticed a knot on the left side of her neck and this eventually who was 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 She was treated with thyroidectomy Pathology revealed 2.4 cm in size with the following findings:  1.  Maximum tumor size (cm): 2.4 cm 2. Tumor location: Left lobe 3. Multicentricity within the lobe: No 4. Histology: Follicular variant of papillary carcinoma with oncocytic features and partially cystic pattern 5. Margins: Negative 6. Capsular invasion: No 7. Extrathyroid extension: No 8. Vascular/Lymphatic invasion: No 9. Lymph nodes: # examined -1; # positive - 0  She does not think she was followed regularly by an endocrinologist subsequently   Past Medical History:  Diagnosis Date  . Hypertension   . Thyroid disease     Past Surgical History:  Procedure Laterality Date  . THYROID SURGERY      Family History  Problem Relation Age of Onset  . Thyroid disease Mother        Thyroidectomy  . Hypertension Mother   . Thyroid disease Maternal Grandmother   . Hypertension Maternal Grandmother   . Diabetes Neg Hx     Social History:  reports that she has never smoked. She has never used smokeless tobacco. She reports that she has current or past drug history. Drug: PCP. She reports that she does not drink alcohol.  Allergies: No Known Allergies  Allergies as of 05/15/2018   No Known Allergies     Medication List        Accurate as of 05/14/18  9:28 PM. Always use your most  recent med list.          amLODipine 5 MG tablet Commonly known as:  NORVASC TK 1 T PO ONCE D   citalopram 40 MG tablet Commonly known as:  CELEXA TK 1 T PO ONCE D   clonazePAM 0.5 MG tablet Commonly known as:  KLONOPIN TK 1 T PO AT BEDTIME PRN   hydrochlorothiazide 25 MG tablet Commonly known as:  HYDRODIURIL Take 25 mg by mouth every morning.   SYNTHROID 200 MCG tablet Generic drug:  levothyroxine TK 1 T PO QAM OES   Vitamin D (Ergocalciferol) 50000 units Caps capsule Commonly known as:  DRISDOL Take by mouth.          Review of Systems             Examination:    There were no vitals taken for this visit.    THYROID: Firm area felt in the medial right  thyroid lobe, not nodular, about 2-3 cm across, felt on swallowing No enlargement felt of the left side    Assessment:  HYPOTHYROIDISM since 2006, postsurgical and currently on 200 mcg levothyroxine  Her TSH was increased in March but no changes in levothyroxine made and needs to have this reassessed now Not clear if she is symptomatic from hypothyroidism as her fatigue and weight gain are likely nonspecific and related to other issues below  History of THYROID cancer diagnosed in 2006, follicular variant of papillary and 2.4 cm in size She had thyroidectomy but no radioactive iodine treatment and no subsequent follow-up assessment of recurrence Her exam indicates possible scar tissue in the right thyroid bed but not clear if this could be remnant thyroid  DEPRESSION and insomnia: Not controlled currently and may well be more important because of her continued fatigue and tendency to weight gain  OBESITY  HYPERTENSION starting in youth, controlled and followed by her PCP  PLAN:  Check thyroid levels including T4, T3 and TSH Discussed adjusting the dose based on her labs Also may consider a combination of liothyronine and levothyroxine if her free T3 level is relatively low  Check thyroglobulin level for her thyroid cancer even though she has had a relatively low-grade tumor  Follow-up in about 6 weeks   Reather LittlerAjay Sukhmani Fetherolf 05/14/2018, 9:28 PM   Consultation note copy sent to the PCP  Note: This office note was prepared with Dragon voice recognition system technology. Any transcriptional errors that result from this process are unintentional.

## 2018-05-15 ENCOUNTER — Ambulatory Visit: Payer: 59 | Admitting: Endocrinology

## 2018-05-15 DIAGNOSIS — Z0289 Encounter for other administrative examinations: Secondary | ICD-10-CM

## 2019-12-13 ENCOUNTER — Ambulatory Visit: Payer: 59 | Attending: Internal Medicine

## 2019-12-13 DIAGNOSIS — Z23 Encounter for immunization: Secondary | ICD-10-CM

## 2019-12-13 NOTE — Progress Notes (Signed)
   Covid-19 Vaccination Clinic  Name:  [name]    MRN: [id number] DOB: Apr 20, 1981  12/13/2019  [name] was observed post Covid-19 immunization for 15 minutes without incident. She was provided with Vaccine Information Sheet and instruction to access the V-Safe system.   [name] was instructed to call 911 with any severe reactions post vaccine: Marland Kitchen Difficulty breathing  . Swelling of face and throat  . A fast heartbeat  . A bad rash all over body  . Dizziness and weakness   Immunizations Administered    Name Date Dose VIS Date Route   Pfizer COVID-19 Vaccine 12/13/2019  9:02 AM 0.3 mL 08/08/2019 Intramuscular   Manufacturer: [employer], [employer]   Lot: [id number]   NDC: [address]-9

## 2020-01-06 ENCOUNTER — Ambulatory Visit: Payer: 59 | Attending: Internal Medicine

## 2020-01-06 DIAGNOSIS — Z23 Encounter for immunization: Secondary | ICD-10-CM

## 2020-01-06 NOTE — Progress Notes (Signed)
   Covid-19 Vaccination Clinic  Name:  [name]    MRN: [id number] DOB: 06-12-1981  01/06/2020  [name] was observed post Covid-19 immunization for 15 minutes without incident. She was provided with Vaccine Information Sheet and instruction to access the V-Safe system.   [name] was instructed to call 911 with any severe reactions post vaccine: Marland Kitchen Difficulty breathing  . Swelling of face and throat  . A fast heartbeat  . A bad rash all over body  . Dizziness and weakness   Immunizations Administered    Name Date Dose VIS Date Route   Pfizer COVID-19 Vaccine 01/06/2020  8:24 AM 0.3 mL 10/22/2018 Intramuscular   Manufacturer: [employer], [employer]   Lot: [id number]   NDC: [address]-0

## 2024-05-06 ENCOUNTER — Other Ambulatory Visit (HOSPITAL_BASED_OUTPATIENT_CLINIC_OR_DEPARTMENT_OTHER): Payer: Self-pay | Admitting: Family Medicine

## 2024-05-06 DIAGNOSIS — Z1231 Encounter for screening mammogram for malignant neoplasm of breast: Secondary | ICD-10-CM

## 2024-06-09 ENCOUNTER — Inpatient Hospital Stay (HOSPITAL_BASED_OUTPATIENT_CLINIC_OR_DEPARTMENT_OTHER): Admission: RE | Admit: 2024-06-09 | Source: Ambulatory Visit | Admitting: Radiology
